# Patient Record
Sex: Male | Born: 1975 | Hispanic: Yes | Marital: Single | Smoking: Never smoker
Health system: Southern US, Community
[De-identification: ages and names within clinical notes are randomized; demographics above are authoritative.]

## PROBLEM LIST (undated history)

## (undated) HISTORY — PX: HERNIA REPAIR: SHX51

---

## 2016-09-29 ENCOUNTER — Emergency Department (HOSPITAL_COMMUNITY)
Admission: EM | Admit: 2016-09-29 | Discharge: 2016-09-29 | Disposition: A | Discharge status: Home / Self Care | Payer: Medicaid - Out of State | Attending: Emergency Medicine | Admitting: Emergency Medicine

## 2016-09-29 ENCOUNTER — Ambulatory Visit (HOSPITAL_COMMUNITY)
Admission: EM | Admit: 2016-09-29 | Discharge: 2016-09-29 | Disposition: A | Discharge status: Other Acute Inpatient Hospital | Payer: Self-pay

## 2016-09-29 ENCOUNTER — Encounter (HOSPITAL_COMMUNITY): Payer: Self-pay | Admitting: Nurse Practitioner

## 2016-09-29 ENCOUNTER — Emergency Department (HOSPITAL_COMMUNITY): Payer: Medicaid - Out of State

## 2016-09-29 DIAGNOSIS — S61330A Puncture wound without foreign body of right index finger with damage to nail, initial encounter: Secondary | ICD-10-CM | POA: Insufficient documentation

## 2016-09-29 DIAGNOSIS — Z23 Encounter for immunization: Secondary | ICD-10-CM | POA: Diagnosis not present

## 2016-09-29 DIAGNOSIS — Y929 Unspecified place or not applicable: Secondary | ICD-10-CM | POA: Diagnosis not present

## 2016-09-29 DIAGNOSIS — Y9389 Activity, other specified: Secondary | ICD-10-CM | POA: Diagnosis not present

## 2016-09-29 DIAGNOSIS — L089 Local infection of the skin and subcutaneous tissue, unspecified: Secondary | ICD-10-CM | POA: Insufficient documentation

## 2016-09-29 DIAGNOSIS — Z79899 Other long term (current) drug therapy: Secondary | ICD-10-CM | POA: Diagnosis not present

## 2016-09-29 DIAGNOSIS — W268XXA Contact with other sharp object(s), not elsewhere classified, initial encounter: Secondary | ICD-10-CM | POA: Diagnosis not present

## 2016-09-29 DIAGNOSIS — T148XXA Other injury of unspecified body region, initial encounter: Secondary | ICD-10-CM

## 2016-09-29 DIAGNOSIS — Y999 Unspecified external cause status: Secondary | ICD-10-CM | POA: Diagnosis not present

## 2016-09-29 DIAGNOSIS — S6991XA Unspecified injury of right wrist, hand and finger(s), initial encounter: Secondary | ICD-10-CM | POA: Diagnosis present

## 2016-09-29 MED ORDER — DOXYCYCLINE HYCLATE 100 MG PO CAPS: 100.0000 mg | ORAL_CAPSULE | Freq: Two times a day (BID) | ORAL | 0 refills | Status: AC

## 2016-09-29 MED ORDER — BUPIVACAINE HCL 0.25 % IJ SOLN
10.0000 mL | Freq: Once | INTRAMUSCULAR | Status: AC
  Administered 2016-09-29: 10 mL
  Filled 2016-09-29: qty 10

## 2016-09-29 MED ORDER — TETANUS-DIPHTH-ACELL PERTUSSIS 5-2.5-18.5 LF-MCG/0.5 IM SUSP
0.5000 mL | Freq: Once | INTRAMUSCULAR | Status: AC
  Administered 2016-09-29: 0.5 mL via INTRAMUSCULAR
  Filled 2016-09-29: qty 0.5

## 2016-09-29 MED ORDER — LIDOCAINE HCL (PF) 1 % IJ SOLN
5.0000 mL | Freq: Once | INTRAMUSCULAR | Status: AC
  Administered 2016-09-29: 5 mL
  Filled 2016-09-29: qty 5

## 2016-09-29 MED ORDER — DICLOFENAC SODIUM 50 MG PO TBEC
50.0000 mg | DELAYED_RELEASE_TABLET | Freq: Two times a day (BID) | ORAL | 0 refills | Status: AC
Start: 2016-09-29 — End: ?

## 2016-09-29 NOTE — ED Provider Notes (Signed)
[facility] Provider Note   CSN: [id number] Arrival date & time: 09/29/16  1504   By signing my name below, I, Roger Brooks, attest that this documentation has been prepared under the direction and in the presence of [facility] M Reiley Keisler, FNP. Electronically Signed: Clarisse GougeXavier Brooks, Scribe. 09/29/16. 3:38 PM.   History   Chief Complaint Chief Complaint  Patient presents with  . Finger Injury   The history is provided by the patient and medical records. No language interpreter was used.    Roger Brooks is a 41 y.o. male presenting to the Emergency Department with chief complaint of R index finger pain onset last night. Associated redness noted to affected fingertip. Pt alleges he was scratching food from a dirty dish with the affected finger leading up to presenting pain; he believes a foreign body became lodged beneath the nail. He describes 10/10, constant, shooting pain radiating up the arm to the elbow. Pain worsened with bending the finger. No PTA medications noted. No other complaints at this time.   History reviewed. No pertinent past medical history.  There are no active problems to display for this patient.   Past Surgical History:  Procedure Laterality Date  . HERNIA REPAIR         Home Medications    Prior to Admission medications   Medication Sig Start Date End Date Taking? Authorizing Provider  diclofenac (VOLTAREN) 50 MG EC tablet Take 1 tablet (50 mg total) by mouth 2 (two) times daily. 09/29/16   Janne NapoleonNeese, Rosbel Buckner M, NP  doxycycline (VIBRAMYCIN) 100 MG capsule Take 1 capsule (100 mg total) by mouth 2 (two) times daily. 09/29/16   Janne NapoleonNeese, Saajan Willmon M, NP    Family History History reviewed. No pertinent family history.  Social History Social History  Substance Use Topics  . Smoking status: Never Smoker  . Smokeless tobacco: Never Used  . Alcohol use Yes     Allergies   Amoxicillin   Review of Systems Review of Systems  Skin: Positive for color  change and wound.  Neurological: Negative for weakness and numbness.  All other systems reviewed and are negative.    Physical Exam Updated Vital Signs BP 124/83   Pulse 85   Temp 98.1 F (36.7 C) (Oral)   Resp 16   SpO2 96%   Physical Exam  Constitutional: He is oriented to person, place, and time. He appears well-developed and well-nourished.  HENT:  Head: Normocephalic.  Eyes: EOM are normal.  Neck: Normal range of motion.  Pulmonary/Chest: Effort normal.  Abdominal: He exhibits no distension.  Musculoskeletal: Normal range of motion.  Swelling and tenderness to distal aspect of R index finger. Puncture wound noted to finger tip that extends under the nail. Pain that radiates up the arm to the elbow. No red streaking noted.  Neurological: He is alert and oriented to person, place, and time.  Psychiatric: He has a normal mood and affect.  Nursing note and vitals reviewed.    ED Treatments / Results  DIAGNOSTIC STUDIES: Oxygen Saturation is 96% on RA, adequate by my interpretation.    COORDINATION OF CARE: 3:37 PM-Discussed next steps with pt. Pt verbalized understanding and is agreeable with the plan. Will order imaging.   Labs (all labs ordered are listed, but only abnormal results are displayed) Labs Reviewed - No data to display  EKG  EKG Interpretation None       Radiology Dg Finger Index Right  Result Date: 09/29/2016 CLINICAL DATA:  Second digit  pain, possible foreign body EXAM: RIGHT INDEX FINGER 2+V COMPARISON:  None. FINDINGS: There is no evidence of fracture or dislocation. There is no evidence of arthropathy or other focal bone abnormality. Soft tissues are unremarkable. IMPRESSION: No acute abnormality noted.  No radiopaque foreign body is seen. Electronically Signed   By: Alcide Clever M.D.   On: 09/29/2016 16:04    Procedures .Nerve Block Date/Time: 09/29/2016 3:45 PM Performed by: Janne Napoleon Authorized by: Janne Napoleon   Consent:     Consent obtained:  Verbal   Consent given by:  Patient   Risks discussed:  Swelling, pain and unsuccessful block   Alternatives discussed:  No treatment Indications:    Indications:  Procedural anesthesia Location:    Body area:  Upper extremity   Laterality:  Right (index finger) Pre-procedure details:    Skin preparation:  Povidone-iodine   Preparation: Patient was prepped and draped in usual sterile fashion   Skin anesthesia (see MAR for exact dosages):    Skin anesthesia method:  Local infiltration Procedure details (see MAR for exact dosages):    Block needle gauge:  25 G   Anesthetic injected:  Bupivacaine 0.25% w/o epi and lidocaine 1% w/o epi   Steroid injected:  None   Additive injected:  None   Injection procedure:  Anatomic landmarks identified Post-procedure details:    Outcome:  Anesthesia achieved   Patient tolerance of procedure:  Tolerated well, no immediate complications Comments:     Area of puncture site probed to assess for possible foreign body. No foreign body palpated using 27 G needle.    (including critical care time)  Medications Ordered in ED Medications  Tdap (BOOSTRIX) injection 0.5 mL (0.5 mLs Intramuscular Given 09/29/16 1630)  lidocaine (PF) (XYLOCAINE) 1 % injection 5 mL (5 mLs Infiltration Given 09/29/16 1715)  bupivacaine (MARCAINE) 0.25 % (with pres) injection 10 mL (10 mLs Infiltration Given 09/29/16 1726)     Initial Impression / Assessment and Plan / ED Course  I have reviewed the triage vital signs and the nursing notes.  Dr. Erma Heritage in to examine the patient and will treat for finger tip infection. Patient stable for d/c with doxycycline and f/u with PCP in 2 days or return here for worsening symptoms.   Final Clinical Impressions(s) / ED Diagnoses   Final diagnoses:  Infected wound    New Prescriptions Discharge Medication List as of 09/29/2016  7:06 PM    START taking these medications   Details  diclofenac (VOLTAREN) 50 MG EC  tablet Take 1 tablet (50 mg total) by mouth 2 (two) times daily., Starting Thu 09/29/2016, Print    doxycycline (VIBRAMYCIN) 100 MG capsule Take 1 capsule (100 mg total) by mouth 2 (two) times daily., Starting Thu 09/29/2016, Print      I personally performed the services described in this documentation, which was scribed in my presence. The recorded information has been reviewed and is accurate.    Kerrie Buffalo [place], [place] 09/29/16 2147    Shaune Pollack, MD 10/08/16 [phone]

## 2016-09-29 NOTE — Discharge Instructions (Signed)
Follow up with your doctor in 2 days for recheck. Return here for worsening symptoms.

## 2016-09-29 NOTE — ED Triage Notes (Addendum)
Pt presents with c/o right pointer finger injury. He was scraping food from dirty dishes with this fingernail yesterday and since has had severe pain radiating up his entire arm and redness at the fingertip. He has taken ibuprofen with minimal relief

## 2016-10-01 ENCOUNTER — Emergency Department (HOSPITAL_COMMUNITY)
Admission: EM | Admit: 2016-10-01 | Discharge: 2016-10-01 | Disposition: A | Discharge status: Home / Self Care | Payer: Medicaid - Out of State | Attending: Emergency Medicine | Admitting: Emergency Medicine

## 2016-10-01 ENCOUNTER — Encounter (HOSPITAL_COMMUNITY): Payer: Self-pay | Admitting: Emergency Medicine

## 2016-10-01 DIAGNOSIS — L089 Local infection of the skin and subcutaneous tissue, unspecified: Secondary | ICD-10-CM

## 2016-10-01 DIAGNOSIS — L02511 Cutaneous abscess of right hand: Secondary | ICD-10-CM | POA: Insufficient documentation

## 2016-10-01 MED ORDER — LIDOCAINE HCL (PF) 1 % IJ SOLN
30.0000 mL | Freq: Once | INTRAMUSCULAR | Status: AC
  Administered 2016-10-01: 30 mL via INTRADERMAL
  Filled 2016-10-01: qty 30

## 2016-10-01 MED ORDER — TRAMADOL HCL 50 MG PO TABS: 50.0000 mg | ORAL_TABLET | Freq: Four times a day (QID) | ORAL | 0 refills | Status: AC | PRN

## 2016-10-01 MED ORDER — HYDROCODONE-ACETAMINOPHEN 5-325 MG PO TABS
1.0000 | ORAL_TABLET | Freq: Once | ORAL | Status: AC
  Administered 2016-10-01: 1 via ORAL
  Filled 2016-10-01: qty 1

## 2016-10-01 MED ORDER — DIPHENHYDRAMINE HCL 25 MG PO CAPS
25.0000 mg | ORAL_CAPSULE | Freq: Once | ORAL | Status: AC
  Administered 2016-10-01: 25 mg via ORAL
  Filled 2016-10-01: qty 1

## 2016-10-01 MED ORDER — VANCOMYCIN HCL IN DEXTROSE 1-5 GM/200ML-% IV SOLN
1000.0000 mg | Freq: Once | INTRAVENOUS | Status: AC
  Administered 2016-10-01: 1000 mg via INTRAVENOUS
  Filled 2016-10-01 (×2): qty 200

## 2016-10-01 MED ORDER — SULFAMETHOXAZOLE-TRIMETHOPRIM 800-160 MG PO TABS: 1.0000 | ORAL_TABLET | Freq: Two times a day (BID) | ORAL | 0 refills | Status: AC

## 2016-10-01 NOTE — ED Provider Notes (Addendum)
[facility] Provider Note   CSN: [id number] Arrival date & time: 10/01/16  1830     History   Chief Complaint Chief Complaint  Patient presents with  . Hand Pain    HPI Roger Brooks is a 41 y.o. male.  Patient was seen 3 days ago with an infection in his right index finger. He has been on doxycycline but feels like it is getting worse.   The history is provided by the patient.  Hand Pain  This is a new problem. The current episode started more than 2 days ago. The problem occurs constantly. The problem has not changed since onset.Pertinent negatives include no chest pain, no abdominal pain and no headaches. Nothing aggravates the symptoms. Nothing relieves the symptoms. Treatments tried: Doxycycline.    History reviewed. No pertinent past medical history.  There are no active problems to display for this patient.   Past Surgical History:  Procedure Laterality Date  . HERNIA REPAIR         Home Medications    Prior to Admission medications   Medication Sig Start Date End Date Taking? Authorizing Provider  diclofenac (VOLTAREN) 50 MG EC tablet Take 1 tablet (50 mg total) by mouth 2 (two) times daily. 09/29/16   Janne Napoleon, NP  doxycycline (VIBRAMYCIN) 100 MG capsule Take 1 capsule (100 mg total) by mouth 2 (two) times daily. 09/29/16   Janne Napoleon, NP  sulfamethoxazole-trimethoprim (BACTRIM DS,SEPTRA DS) 800-160 MG tablet Take 1 tablet by mouth 2 (two) times daily. 10/01/16 10/08/16  Bethann Berkshire, MD  traMADol (ULTRAM) 50 MG tablet Take 1 tablet (50 mg total) by mouth every 6 (six) hours as needed. 10/01/16   Bethann Berkshire, MD    Family History No family history on file.  Social History Social History  Substance Use Topics  . Smoking status: Never Smoker  . Smokeless tobacco: Never Used  . Alcohol use Yes     Allergies   Amoxicillin   Review of Systems Review of Systems  Constitutional: Negative for appetite change and fatigue.    HENT: Negative for congestion, ear discharge and sinus pressure.   Eyes: Negative for discharge.  Respiratory: Negative for cough.   Cardiovascular: Negative for chest pain.  Gastrointestinal: Negative for abdominal pain and diarrhea.  Genitourinary: Negative for frequency and hematuria.  Musculoskeletal: Negative for back pain.       Painful right index finger  Skin: Negative for rash.  Neurological: Negative for seizures and headaches.  Psychiatric/Behavioral: Negative for hallucinations.     Physical Exam Updated Vital Signs BP 127/67   Pulse 62   Temp 98.1 F (36.7 C) (Oral)   Resp 16   Ht 6\' 1"  (1.854 m)   Wt 87.5 kg (193 lb)   SpO2 98%   BMI 25.46 kg/m   Physical Exam  Constitutional: He is oriented to person, place, and time. He appears well-developed.  HENT:  Head: Normocephalic.  Eyes: Conjunctivae and EOM are normal. No scleral icterus.  Neck: Neck supple. No thyromegaly present.  Cardiovascular: Normal rate and regular rhythm.  Exam reveals no gallop and no friction rub.   No murmur heard. Pulmonary/Chest: No stridor. He has no wheezes. He has no rales. He exhibits no tenderness.  Abdominal: He exhibits no distension. There is no tenderness. There is no rebound.  Musculoskeletal: Normal range of motion. He exhibits no edema.  Patient has a felon to the distal right index finger. Small amount of pus seen.  Lymphadenopathy:  He has no cervical adenopathy.  Neurological: He is oriented to person, place, and time. He exhibits normal muscle tone. Coordination normal.  Skin: No rash noted. No erythema.  Psychiatric: He has a normal mood and affect. His behavior is normal.     ED Treatments / Results  Labs (all labs ordered are listed, but only abnormal results are displayed) Labs Reviewed - No data to display  EKG  EKG Interpretation None       Radiology No results found.  Procedures .Marland Kitchen.Incision and Drainage Date/Time: 10/01/2016 8:18  PM Performed by: Bethann BerkshireZAMMIT, Meghan Tiemann Authorized by: Bethann BerkshireZAMMIT, Violanda Bobeck   Consent:    Consent obtained:  Verbal   Consent given by:  Patient   Risks discussed:  Bleeding   Alternatives discussed:  Delayed treatment Location:    Type:  Abscess   Size:  Smaa Pre-procedure details:    Skin preparation:  Antiseptic wash Anesthesia (see MAR for exact dosages):    Anesthesia method:  Nerve block   Block anesthetic:  Lidocaine 2% w/o epi   Block technique:  Diggital block right index Procedure type:    Complexity:  Simple Procedure details:    Needle aspiration: no     Incision types:  Stab incision   Incision depth:  Subcutaneous   Scalpel blade:  11 Comments:     Patient had digital block done to the right index finger. Lidocaine without epi was used to numb the finger. Patient tolerated the procedure well. He then had an incision to the distal right finger small amount of pus was removed. Iodoform gauze was used to pack the area    (including critical care time)  Medications Ordered in ED Medications  vancomycin (VANCOCIN) IVPB 1000 mg/200 mL premix (not administered)  lidocaine (PF) (XYLOCAINE) 1 % injection 30 mL (not administered)     Initial Impression / Assessment and Plan / ED Course  I have reviewed the triage vital signs and the nursing notes.  Pertinent labs & imaging results that were available during my care of the patient were reviewed by me and considered in my medical decision making (see chart for details).       Final Clinical Impressions(s) / ED Diagnoses   Final diagnoses:  Infected finger   Patient with infected distal right finger. He is given a dose of vancomycin here he is going to continue the doxycycline and start some Bactrim and is going to follow-up in 2 days for recheck New Prescriptions New Prescriptions   SULFAMETHOXAZOLE-TRIMETHOPRIM (BACTRIM DS,SEPTRA DS) 800-160 MG TABLET    Take 1 tablet by mouth 2 (two) times daily.   TRAMADOL (ULTRAM) 50  MG TABLET    Take 1 tablet (50 mg total) by mouth every 6 (six) hours as needed.     Bethann BerkshireZammit, Amarisa Wilinski, MD 10/01/16 2019    Bethann BerkshireZammit, Altheria Shadoan, MD 10/14/16 1028

## 2016-10-01 NOTE — Discharge Instructions (Signed)
Follow up Monday for recheck with dr. Orlan Leavensrtman or return to emergency department

## 2016-10-01 NOTE — ED Triage Notes (Signed)
Pt seen here a few days ago for a piece of dried spaghetti that got stuck under R pointer fingernail, states they tried to remove it and they gave him antibiotics, states his finger swelling is worse and it feels like its going to explode. Pt R pointer finger red, swollen, streaking down finger.

## 2016-10-03 ENCOUNTER — Encounter (HOSPITAL_COMMUNITY): Payer: Self-pay

## 2016-10-03 ENCOUNTER — Emergency Department (HOSPITAL_COMMUNITY)
Admission: EM | Admit: 2016-10-03 | Discharge: 2016-10-03 | Disposition: A | Discharge status: Home / Self Care | Payer: Medicaid - Out of State | Attending: Emergency Medicine | Admitting: Emergency Medicine

## 2016-10-03 DIAGNOSIS — Z4801 Encounter for change or removal of surgical wound dressing: Secondary | ICD-10-CM | POA: Insufficient documentation

## 2016-10-03 DIAGNOSIS — Z5189 Encounter for other specified aftercare: Secondary | ICD-10-CM

## 2016-10-03 MED ORDER — HYDROCODONE-ACETAMINOPHEN 5-325 MG PO TABS: 1.0000 | ORAL_TABLET | ORAL | 0 refills | Status: AC | PRN

## 2016-10-03 NOTE — ED Provider Notes (Signed)
[facility] Provider Note   CSN: [id number] Arrival date & time: 10/03/16  1245  By signing my name below, I, Thelma Bargeick Cochran, attest that this documentation has been prepared under the direction and in the presence of Melburn HakeNicole Evian Salguero, [place]. Electronically Signed: Thelma Bargeick Cochran, Scribe. 10/03/16. 3:58 PM.  History   Chief Complaint Chief Complaint  Patient presents with  . recheck finger   The history is provided by the patient. No language interpreter was used.    HPI Comments: Roger Brooks is a 41 y.o. male who presents to the Emergency Department requesting a wound check after he was seen here on 09-29-16. The wound was on his right index finger and was red with associated swelling that was opened and drained. He has been taking doxycycline. He was prescribed bactrim but he has not gotten this prescription filled yet. His swelling and redness have improved since he was last seen in the ED 2 days ago when he had an I&D performed. He notes it is pruritic at times. He states he removed small amount of cloudy, bloody, and yellow pus from it earlier today. The pain has improved since his last visit. He denies fever. He tried to go to the hand specialist but they were unable to see him. Of note pt states he is from out of town and is [place] [facility] [facility].   History reviewed. No pertinent past medical history.  There are no active problems to display for this patient.   Past Surgical History:  Procedure Laterality Date  . HERNIA REPAIR         Home Medications    Prior to Admission medications   Medication Sig Start Date End Date Taking? Authorizing Provider  diclofenac (VOLTAREN) 50 MG EC tablet Take 1 tablet (50 mg total) by mouth 2 (two) times daily. 09/29/16   Janne NapoleonNeese, Hope M, NP  doxycycline (VIBRAMYCIN) 100 MG capsule Take 1 capsule (100 mg total) by mouth 2 (two) times daily. 09/29/16   Janne NapoleonNeese, Hope M, NP  HYDROcodone-acetaminophen  (NORCO/VICODIN) 5-325 MG tablet Take 1 tablet by mouth every 4 (four) hours as needed. 10/03/16   Barrett HenleNadeau, Armand Preast Elizabeth, PA-C  sulfamethoxazole-trimethoprim (BACTRIM DS,SEPTRA DS) 800-160 MG tablet Take 1 tablet by mouth 2 (two) times daily. 10/01/16 10/08/16  Bethann BerkshireZammit, Joseph, MD  traMADol (ULTRAM) 50 MG tablet Take 1 tablet (50 mg total) by mouth every 6 (six) hours as needed. 10/01/16   Bethann BerkshireZammit, Joseph, MD    Family History No family history on file.  Social History Social History  Substance Use Topics  . Smoking status: Never Smoker  . Smokeless tobacco: Never Used  . Alcohol use Yes     Allergies   Amoxicillin   Review of Systems Review of Systems  Constitutional: Negative for fever.  Skin: Positive for color change and wound.     Physical Exam Updated Vital Signs BP 131/86   Pulse 67   Temp 98.3 F (36.8 C) (Oral)   Resp 16   SpO2 98%   Physical Exam  Constitutional: He is oriented to person, place, and time. He appears well-developed and well-nourished.  HENT:  Head: Normocephalic and atraumatic.  Eyes: Conjunctivae and EOM are normal. Right eye exhibits no discharge. Left eye exhibits no discharge. No scleral icterus.  Neck: Normal range of motion. Neck supple.  Cardiovascular: Normal rate and intact distal pulses.   Pulmonary/Chest: Effort normal.  Musculoskeletal: Normal range of motion. He exhibits tenderness. He exhibits  no edema or deformity.  Mild swelling and tenderness present to finger pad of right index finger with packing in place Packing slightly soaked with dried blood, no purulent drainage present No surrounding erythema, warmth, or swelling Full ROM of right index finger, hand, and wrist with 5/5 strength.  Sensation grossly intact Cap refill <2 2+ radial pulse  Neurological: He is alert and oriented to person, place, and time.  Skin: Skin is warm and dry. Capillary refill takes less than 2 seconds.  Nursing note and vitals reviewed.    ED  Treatments / Results  DIAGNOSTIC STUDIES: Oxygen Saturation is 98% on RA, normal by my interpretation.    COORDINATION OF CARE: 3:33 PM Discussed treatment plan with pt at bedside and pt agreed to plan.  Labs (all labs ordered are listed, but only abnormal results are displayed) Labs Reviewed - No data to display  EKG  EKG Interpretation None       Radiology No results found.  Procedures Procedures (including critical care time)  Medications Ordered in ED Medications - No data to display   Initial Impression / Assessment and Plan / ED Course  I have reviewed the triage vital signs and the nursing notes.  Pertinent labs & imaging results that were available during my care of the patient were reviewed by me and considered in my medical decision making (see chart for details).     Patient presents for wound recheck of his right index finger. Chart review shows patient was initially seen in the ED on 09/29/16 for red swollen finger, was started on doxycycline. Symptoms worsen resulting patient coming to the ED on 10/01/16 when he had an I&D performed for his felon and was also started on Bactrim. Patient reports significant improvement of symptoms but states he came to have his packing removed prior to starting his right back home to [place]. Denies fever but reports having small amount of purulent drainage from wound. VSS. Exam revealed mild swelling and tenderness to finger pad of right index finger with packing in place. Packing soaked with small amount of dried blood, no purulent drainage present. Right hand neurovascularly intact with full range of motion. No evidence of cellulitis. Packing removed without any complications, wound irrigated with normal saline. Plan to discharge patient home with wound care. Advised patient to continue taking both of his antibiotics as prescribed until completed. Advised patient to follow up with his PCP within the next week as needed. Discussed  strict return precautions. Patient reports understanding and agreement.  Final Clinical Impressions(s) / ED Diagnoses   Final diagnoses:  Visit for wound check    New Prescriptions New Prescriptions   HYDROCODONE-ACETAMINOPHEN (NORCO/VICODIN) 5-325 MG TABLET    Take 1 tablet by mouth every 4 (four) hours as needed.   I personally performed the services described in this documentation, which was scribed in my presence. The recorded information has been reviewed and is accurate.     Barrett Henle, PA-C 10/03/16 1619    Rolan Bucco, MD 10/03/16 [phone]

## 2016-10-03 NOTE — Discharge Instructions (Signed)
Keep wound clean using indirect 0 soap and water, pat dry. I also recommend continuing to soak your finger in warm water for 15-20 minutes 3-4 times daily. Take your medication as prescribed as needed for pain relief. You may also take ibuprofen 600 mg every 6 hours as needed for additional relief. I recommend following up with your primary care provider in the next 3-4 days for wound recheck as needed. Return to the emergency department if symptoms worsen or new onset of fever, redness, swelling, warmth, red streaking, purulent (pus) drainage, worsening pain, decreased range of motion, numbness, weakness.

## 2016-10-03 NOTE — ED Triage Notes (Signed)
Patient here to have right hand index finger rechecked. Drain in place and unable to see Hydrographic surveyorhand surgeon before he leave for [place] today. NAD

## 2018-09-15 IMAGING — CR DG FINGER INDEX 2+V*R*
3 series · 3 of 3 positions shown · non-contrast
Comparison: None.

CLINICAL DATA: Second digit pain, possible foreign body

EXAM:
RIGHT INDEX FINGER 2+V

[finger ap]
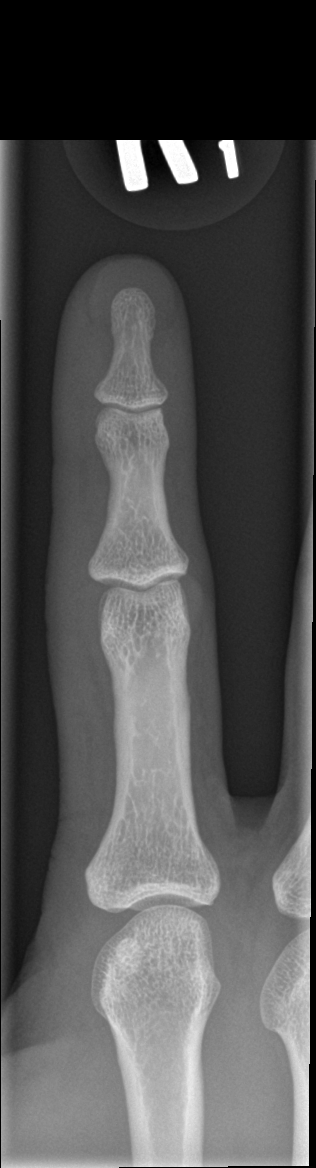

[finger obl]
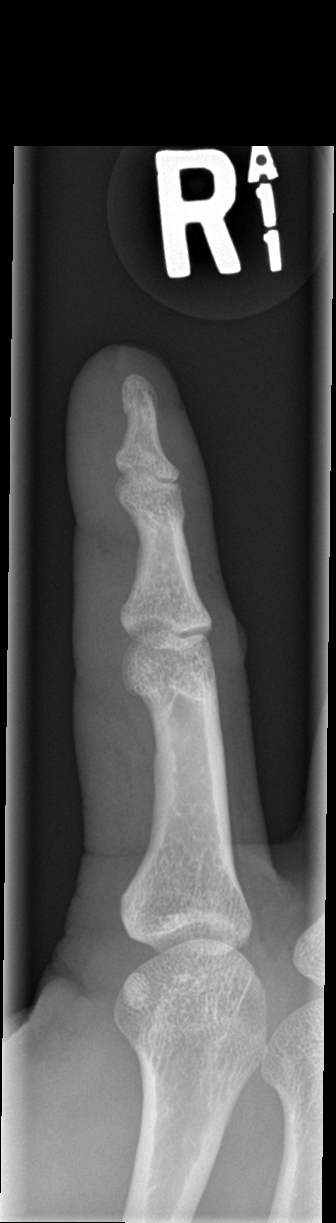

[finger lat]
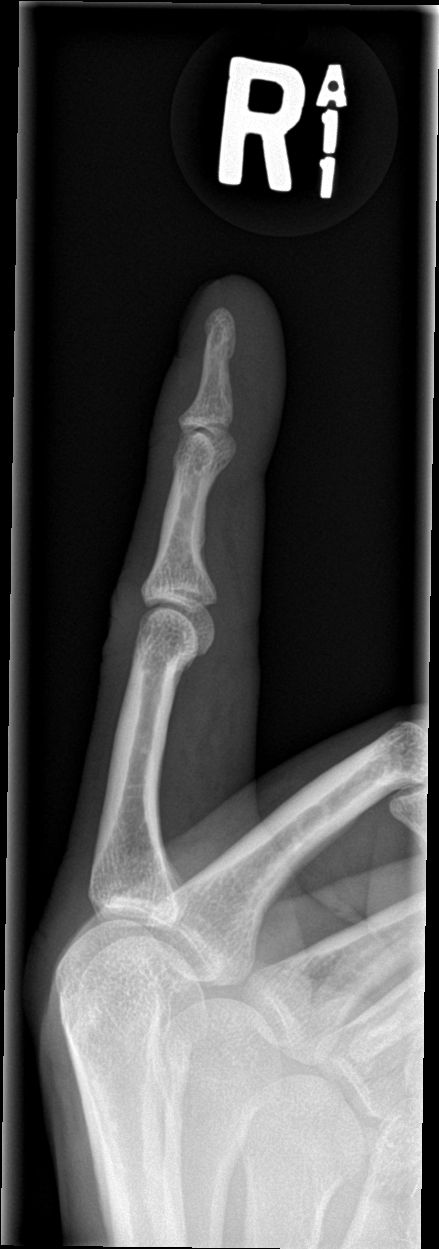

[3 of 3 positions shown; findings below may reference images not displayed]

FINDINGS: There is no evidence of fracture or dislocation. There is no
evidence of arthropathy or other focal bone abnormality. Soft
tissues are unremarkable.
IMPRESSION: No acute abnormality noted.  No radiopaque foreign body is seen.
